# Patient Record
Sex: Male | Born: 1960
Health system: Southern US, Community
[De-identification: ages and names within clinical notes are randomized; demographics above are authoritative.]

---

## 2013-10-19 DIAGNOSIS — I10 Essential (primary) hypertension: Secondary | ICD-10-CM | POA: Diagnosis not present

## 2013-10-19 DIAGNOSIS — F17200 Nicotine dependence, unspecified, uncomplicated: Secondary | ICD-10-CM | POA: Diagnosis not present

## 2013-10-19 DIAGNOSIS — R197 Diarrhea, unspecified: Secondary | ICD-10-CM | POA: Diagnosis not present

## 2013-10-19 DIAGNOSIS — E7800 Pure hypercholesterolemia, unspecified: Secondary | ICD-10-CM | POA: Diagnosis not present

## 2013-10-19 DIAGNOSIS — R1084 Generalized abdominal pain: Secondary | ICD-10-CM | POA: Diagnosis not present

## 2013-10-19 DIAGNOSIS — Z79899 Other long term (current) drug therapy: Secondary | ICD-10-CM | POA: Diagnosis not present

## 2013-10-19 DIAGNOSIS — K5289 Other specified noninfective gastroenteritis and colitis: Secondary | ICD-10-CM | POA: Diagnosis not present

## 2013-10-19 DIAGNOSIS — D739 Disease of spleen, unspecified: Secondary | ICD-10-CM | POA: Diagnosis not present

## 2013-10-27 DIAGNOSIS — Z8719 Personal history of other diseases of the digestive system: Secondary | ICD-10-CM | POA: Diagnosis not present

## 2013-10-27 DIAGNOSIS — F17200 Nicotine dependence, unspecified, uncomplicated: Secondary | ICD-10-CM | POA: Diagnosis not present

## 2013-10-27 DIAGNOSIS — D739 Disease of spleen, unspecified: Secondary | ICD-10-CM | POA: Diagnosis not present

## 2013-10-27 DIAGNOSIS — K5289 Other specified noninfective gastroenteritis and colitis: Secondary | ICD-10-CM | POA: Diagnosis not present

## 2013-10-31 DIAGNOSIS — D739 Disease of spleen, unspecified: Secondary | ICD-10-CM | POA: Diagnosis not present

## 2013-11-14 DIAGNOSIS — Z1211 Encounter for screening for malignant neoplasm of colon: Secondary | ICD-10-CM | POA: Diagnosis not present

## 2013-11-14 DIAGNOSIS — R935 Abnormal findings on diagnostic imaging of other abdominal regions, including retroperitoneum: Secondary | ICD-10-CM | POA: Diagnosis not present

## 2013-11-14 DIAGNOSIS — R1031 Right lower quadrant pain: Secondary | ICD-10-CM | POA: Diagnosis not present

## 2013-11-14 DIAGNOSIS — F17200 Nicotine dependence, unspecified, uncomplicated: Secondary | ICD-10-CM | POA: Diagnosis not present

## 2013-11-23 DIAGNOSIS — F17200 Nicotine dependence, unspecified, uncomplicated: Secondary | ICD-10-CM | POA: Diagnosis not present

## 2013-11-23 DIAGNOSIS — I10 Essential (primary) hypertension: Secondary | ICD-10-CM | POA: Diagnosis not present

## 2013-11-23 DIAGNOSIS — K5289 Other specified noninfective gastroenteritis and colitis: Secondary | ICD-10-CM | POA: Diagnosis not present

## 2013-11-23 DIAGNOSIS — E785 Hyperlipidemia, unspecified: Secondary | ICD-10-CM | POA: Diagnosis not present

## 2013-11-23 DIAGNOSIS — D739 Disease of spleen, unspecified: Secondary | ICD-10-CM | POA: Diagnosis not present

## 2013-11-23 DIAGNOSIS — Z125 Encounter for screening for malignant neoplasm of prostate: Secondary | ICD-10-CM | POA: Diagnosis not present

## 2014-06-05 DIAGNOSIS — R102 Pelvic and perineal pain: Secondary | ICD-10-CM | POA: Diagnosis not present

## 2014-06-05 DIAGNOSIS — D739 Disease of spleen, unspecified: Secondary | ICD-10-CM | POA: Diagnosis not present

## 2014-06-05 DIAGNOSIS — I10 Essential (primary) hypertension: Secondary | ICD-10-CM | POA: Diagnosis not present

## 2014-06-05 DIAGNOSIS — E785 Hyperlipidemia, unspecified: Secondary | ICD-10-CM | POA: Diagnosis not present

## 2014-06-05 DIAGNOSIS — Z2821 Immunization not carried out because of patient refusal: Secondary | ICD-10-CM | POA: Diagnosis not present

## 2014-06-25 DIAGNOSIS — R109 Unspecified abdominal pain: Secondary | ICD-10-CM | POA: Diagnosis not present

## 2014-06-25 DIAGNOSIS — D739 Disease of spleen, unspecified: Secondary | ICD-10-CM | POA: Diagnosis not present

## 2014-08-21 DIAGNOSIS — T25222A Burn of second degree of left foot, initial encounter: Secondary | ICD-10-CM | POA: Diagnosis not present

## 2014-08-21 DIAGNOSIS — T2125XA Burn of second degree of buttock, initial encounter: Secondary | ICD-10-CM | POA: Diagnosis not present

## 2014-08-21 DIAGNOSIS — M1990 Unspecified osteoarthritis, unspecified site: Secondary | ICD-10-CM | POA: Diagnosis not present

## 2014-08-21 DIAGNOSIS — R350 Frequency of micturition: Secondary | ICD-10-CM | POA: Diagnosis not present

## 2014-08-21 DIAGNOSIS — T2124XA Burn of second degree of lower back, initial encounter: Secondary | ICD-10-CM | POA: Diagnosis not present

## 2014-08-21 DIAGNOSIS — T23661A Corrosion of second degree back of right hand, initial encounter: Secondary | ICD-10-CM | POA: Diagnosis not present

## 2014-08-21 DIAGNOSIS — Z23 Encounter for immunization: Secondary | ICD-10-CM | POA: Diagnosis not present

## 2014-08-21 DIAGNOSIS — T24211A Burn of second degree of right thigh, initial encounter: Secondary | ICD-10-CM | POA: Diagnosis not present

## 2014-08-21 DIAGNOSIS — J45909 Unspecified asthma, uncomplicated: Secondary | ICD-10-CM | POA: Diagnosis not present

## 2014-08-21 DIAGNOSIS — T310 Burns involving less than 10% of body surface: Secondary | ICD-10-CM | POA: Diagnosis not present

## 2014-08-21 DIAGNOSIS — F17290 Nicotine dependence, other tobacco product, uncomplicated: Secondary | ICD-10-CM | POA: Diagnosis not present

## 2014-08-21 DIAGNOSIS — E780 Pure hypercholesterolemia: Secondary | ICD-10-CM | POA: Diagnosis not present

## 2014-08-21 DIAGNOSIS — T24222A Burn of second degree of left knee, initial encounter: Secondary | ICD-10-CM | POA: Diagnosis not present

## 2014-08-21 DIAGNOSIS — I10 Essential (primary) hypertension: Secondary | ICD-10-CM | POA: Diagnosis not present

## 2014-08-23 DIAGNOSIS — E119 Type 2 diabetes mellitus without complications: Secondary | ICD-10-CM | POA: Diagnosis not present

## 2014-08-23 DIAGNOSIS — T24222D Burn of second degree of left knee, subsequent encounter: Secondary | ICD-10-CM | POA: Diagnosis not present

## 2014-08-23 DIAGNOSIS — T2125XD Burn of second degree of buttock, subsequent encounter: Secondary | ICD-10-CM | POA: Diagnosis not present

## 2014-08-23 DIAGNOSIS — I10 Essential (primary) hypertension: Secondary | ICD-10-CM | POA: Diagnosis not present

## 2014-08-23 DIAGNOSIS — J45909 Unspecified asthma, uncomplicated: Secondary | ICD-10-CM | POA: Diagnosis not present

## 2014-08-23 DIAGNOSIS — M150 Primary generalized (osteo)arthritis: Secondary | ICD-10-CM | POA: Diagnosis not present

## 2014-08-23 DIAGNOSIS — T24212D Burn of second degree of left thigh, subsequent encounter: Secondary | ICD-10-CM | POA: Diagnosis not present

## 2014-08-23 DIAGNOSIS — T25292D Burn of second degree of multiple sites of left ankle and foot, subsequent encounter: Secondary | ICD-10-CM | POA: Diagnosis not present

## 2014-08-23 DIAGNOSIS — J449 Chronic obstructive pulmonary disease, unspecified: Secondary | ICD-10-CM | POA: Diagnosis not present

## 2014-08-23 DIAGNOSIS — T2124XD Burn of second degree of lower back, subsequent encounter: Secondary | ICD-10-CM | POA: Diagnosis not present

## 2014-08-23 DIAGNOSIS — G629 Polyneuropathy, unspecified: Secondary | ICD-10-CM | POA: Diagnosis not present

## 2014-08-23 DIAGNOSIS — Z720 Tobacco use: Secondary | ICD-10-CM | POA: Diagnosis not present

## 2014-08-24 DIAGNOSIS — T24212D Burn of second degree of left thigh, subsequent encounter: Secondary | ICD-10-CM | POA: Diagnosis not present

## 2014-08-24 DIAGNOSIS — T24222D Burn of second degree of left knee, subsequent encounter: Secondary | ICD-10-CM | POA: Diagnosis not present

## 2014-08-24 DIAGNOSIS — T25292D Burn of second degree of multiple sites of left ankle and foot, subsequent encounter: Secondary | ICD-10-CM | POA: Diagnosis not present

## 2014-08-24 DIAGNOSIS — E119 Type 2 diabetes mellitus without complications: Secondary | ICD-10-CM | POA: Diagnosis not present

## 2014-08-24 DIAGNOSIS — T2125XD Burn of second degree of buttock, subsequent encounter: Secondary | ICD-10-CM | POA: Diagnosis not present

## 2014-08-24 DIAGNOSIS — T2124XD Burn of second degree of lower back, subsequent encounter: Secondary | ICD-10-CM | POA: Diagnosis not present

## 2014-08-27 DIAGNOSIS — T2124XD Burn of second degree of lower back, subsequent encounter: Secondary | ICD-10-CM | POA: Diagnosis not present

## 2014-08-27 DIAGNOSIS — T24212D Burn of second degree of left thigh, subsequent encounter: Secondary | ICD-10-CM | POA: Diagnosis not present

## 2014-08-27 DIAGNOSIS — T2125XD Burn of second degree of buttock, subsequent encounter: Secondary | ICD-10-CM | POA: Diagnosis not present

## 2014-08-27 DIAGNOSIS — T25292D Burn of second degree of multiple sites of left ankle and foot, subsequent encounter: Secondary | ICD-10-CM | POA: Diagnosis not present

## 2014-08-27 DIAGNOSIS — E119 Type 2 diabetes mellitus without complications: Secondary | ICD-10-CM | POA: Diagnosis not present

## 2014-08-27 DIAGNOSIS — T24222D Burn of second degree of left knee, subsequent encounter: Secondary | ICD-10-CM | POA: Diagnosis not present

## 2014-08-28 DIAGNOSIS — T2124XD Burn of second degree of lower back, subsequent encounter: Secondary | ICD-10-CM | POA: Diagnosis not present

## 2014-08-28 DIAGNOSIS — T24211D Burn of second degree of right thigh, subsequent encounter: Secondary | ICD-10-CM | POA: Diagnosis not present

## 2014-08-28 DIAGNOSIS — T25222D Burn of second degree of left foot, subsequent encounter: Secondary | ICD-10-CM | POA: Diagnosis not present

## 2014-08-28 DIAGNOSIS — T2125XD Burn of second degree of buttock, subsequent encounter: Secondary | ICD-10-CM | POA: Diagnosis not present

## 2014-08-28 DIAGNOSIS — T310 Burns involving less than 10% of body surface: Secondary | ICD-10-CM | POA: Diagnosis not present

## 2014-08-28 DIAGNOSIS — T24222D Burn of second degree of left knee, subsequent encounter: Secondary | ICD-10-CM | POA: Diagnosis not present

## 2014-08-31 DIAGNOSIS — T2124XD Burn of second degree of lower back, subsequent encounter: Secondary | ICD-10-CM | POA: Diagnosis not present

## 2014-08-31 DIAGNOSIS — T2125XD Burn of second degree of buttock, subsequent encounter: Secondary | ICD-10-CM | POA: Diagnosis not present

## 2014-08-31 DIAGNOSIS — T24222D Burn of second degree of left knee, subsequent encounter: Secondary | ICD-10-CM | POA: Diagnosis not present

## 2014-08-31 DIAGNOSIS — T25292D Burn of second degree of multiple sites of left ankle and foot, subsequent encounter: Secondary | ICD-10-CM | POA: Diagnosis not present

## 2014-08-31 DIAGNOSIS — E119 Type 2 diabetes mellitus without complications: Secondary | ICD-10-CM | POA: Diagnosis not present

## 2014-08-31 DIAGNOSIS — T24212D Burn of second degree of left thigh, subsequent encounter: Secondary | ICD-10-CM | POA: Diagnosis not present

## 2014-09-05 DIAGNOSIS — T310 Burns involving less than 10% of body surface: Secondary | ICD-10-CM | POA: Diagnosis not present

## 2014-09-05 DIAGNOSIS — T2125XD Burn of second degree of buttock, subsequent encounter: Secondary | ICD-10-CM | POA: Diagnosis not present

## 2014-09-05 DIAGNOSIS — T25222D Burn of second degree of left foot, subsequent encounter: Secondary | ICD-10-CM | POA: Diagnosis not present

## 2014-09-05 DIAGNOSIS — T2123XD Burn of second degree of upper back, subsequent encounter: Secondary | ICD-10-CM | POA: Diagnosis not present

## 2014-09-05 DIAGNOSIS — T24222D Burn of second degree of left knee, subsequent encounter: Secondary | ICD-10-CM | POA: Diagnosis not present

## 2014-09-07 DIAGNOSIS — T24222D Burn of second degree of left knee, subsequent encounter: Secondary | ICD-10-CM | POA: Diagnosis not present

## 2014-09-07 DIAGNOSIS — T2125XD Burn of second degree of buttock, subsequent encounter: Secondary | ICD-10-CM | POA: Diagnosis not present

## 2014-09-07 DIAGNOSIS — T2124XD Burn of second degree of lower back, subsequent encounter: Secondary | ICD-10-CM | POA: Diagnosis not present

## 2014-09-07 DIAGNOSIS — T25292D Burn of second degree of multiple sites of left ankle and foot, subsequent encounter: Secondary | ICD-10-CM | POA: Diagnosis not present

## 2014-09-07 DIAGNOSIS — E119 Type 2 diabetes mellitus without complications: Secondary | ICD-10-CM | POA: Diagnosis not present

## 2014-09-07 DIAGNOSIS — T24212D Burn of second degree of left thigh, subsequent encounter: Secondary | ICD-10-CM | POA: Diagnosis not present

## 2014-09-14 DIAGNOSIS — T2124XD Burn of second degree of lower back, subsequent encounter: Secondary | ICD-10-CM | POA: Diagnosis not present

## 2014-09-14 DIAGNOSIS — T24222D Burn of second degree of left knee, subsequent encounter: Secondary | ICD-10-CM | POA: Diagnosis not present

## 2014-09-14 DIAGNOSIS — T24212D Burn of second degree of left thigh, subsequent encounter: Secondary | ICD-10-CM | POA: Diagnosis not present

## 2014-09-14 DIAGNOSIS — T25292D Burn of second degree of multiple sites of left ankle and foot, subsequent encounter: Secondary | ICD-10-CM | POA: Diagnosis not present

## 2014-09-14 DIAGNOSIS — E119 Type 2 diabetes mellitus without complications: Secondary | ICD-10-CM | POA: Diagnosis not present

## 2014-09-14 DIAGNOSIS — T2125XD Burn of second degree of buttock, subsequent encounter: Secondary | ICD-10-CM | POA: Diagnosis not present

## 2015-03-25 DIAGNOSIS — Z1389 Encounter for screening for other disorder: Secondary | ICD-10-CM | POA: Diagnosis not present

## 2015-03-25 DIAGNOSIS — E785 Hyperlipidemia, unspecified: Secondary | ICD-10-CM | POA: Diagnosis not present

## 2015-03-25 DIAGNOSIS — I10 Essential (primary) hypertension: Secondary | ICD-10-CM | POA: Diagnosis not present

## 2015-03-25 DIAGNOSIS — Z79899 Other long term (current) drug therapy: Secondary | ICD-10-CM | POA: Diagnosis not present

## 2015-03-25 DIAGNOSIS — M25569 Pain in unspecified knee: Secondary | ICD-10-CM | POA: Diagnosis not present

## 2015-03-25 DIAGNOSIS — D739 Disease of spleen, unspecified: Secondary | ICD-10-CM | POA: Diagnosis not present

## 2015-03-25 DIAGNOSIS — Z6826 Body mass index (BMI) 26.0-26.9, adult: Secondary | ICD-10-CM | POA: Diagnosis not present

## 2015-11-09 DIAGNOSIS — T1502XA Foreign body in cornea, left eye, initial encounter: Secondary | ICD-10-CM | POA: Diagnosis not present

## 2016-10-13 DIAGNOSIS — Z79899 Other long term (current) drug therapy: Secondary | ICD-10-CM | POA: Diagnosis not present

## 2016-10-13 DIAGNOSIS — D739 Disease of spleen, unspecified: Secondary | ICD-10-CM | POA: Diagnosis not present

## 2016-10-13 DIAGNOSIS — M1711 Unilateral primary osteoarthritis, right knee: Secondary | ICD-10-CM | POA: Diagnosis not present

## 2016-10-13 DIAGNOSIS — M1712 Unilateral primary osteoarthritis, left knee: Secondary | ICD-10-CM | POA: Diagnosis not present

## 2016-10-13 DIAGNOSIS — M1710 Unilateral primary osteoarthritis, unspecified knee: Secondary | ICD-10-CM | POA: Diagnosis not present

## 2016-10-13 DIAGNOSIS — I10 Essential (primary) hypertension: Secondary | ICD-10-CM | POA: Diagnosis not present

## 2016-10-13 DIAGNOSIS — Z6827 Body mass index (BMI) 27.0-27.9, adult: Secondary | ICD-10-CM | POA: Diagnosis not present

## 2016-10-13 DIAGNOSIS — E785 Hyperlipidemia, unspecified: Secondary | ICD-10-CM | POA: Diagnosis not present

## 2016-10-13 DIAGNOSIS — Z9181 History of falling: Secondary | ICD-10-CM | POA: Diagnosis not present

## 2016-10-13 DIAGNOSIS — Z1211 Encounter for screening for malignant neoplasm of colon: Secondary | ICD-10-CM | POA: Diagnosis not present

## 2016-10-13 DIAGNOSIS — Z1389 Encounter for screening for other disorder: Secondary | ICD-10-CM | POA: Diagnosis not present

## 2018-02-02 DIAGNOSIS — Z23 Encounter for immunization: Secondary | ICD-10-CM | POA: Diagnosis not present

## 2018-02-02 DIAGNOSIS — Z1331 Encounter for screening for depression: Secondary | ICD-10-CM | POA: Diagnosis not present

## 2018-02-02 DIAGNOSIS — E785 Hyperlipidemia, unspecified: Secondary | ICD-10-CM | POA: Diagnosis not present

## 2018-02-02 DIAGNOSIS — Z125 Encounter for screening for malignant neoplasm of prostate: Secondary | ICD-10-CM | POA: Diagnosis not present

## 2018-02-02 DIAGNOSIS — Z1211 Encounter for screening for malignant neoplasm of colon: Secondary | ICD-10-CM | POA: Diagnosis not present

## 2018-02-02 DIAGNOSIS — Z0000 Encounter for general adult medical examination without abnormal findings: Secondary | ICD-10-CM | POA: Diagnosis not present

## 2018-02-09 DIAGNOSIS — E785 Hyperlipidemia, unspecified: Secondary | ICD-10-CM | POA: Diagnosis not present

## 2018-02-09 DIAGNOSIS — Z79899 Other long term (current) drug therapy: Secondary | ICD-10-CM | POA: Diagnosis not present

## 2018-02-09 DIAGNOSIS — M25512 Pain in left shoulder: Secondary | ICD-10-CM | POA: Diagnosis not present

## 2018-02-09 DIAGNOSIS — D739 Disease of spleen, unspecified: Secondary | ICD-10-CM | POA: Diagnosis not present

## 2018-02-09 DIAGNOSIS — I10 Essential (primary) hypertension: Secondary | ICD-10-CM | POA: Diagnosis not present

## 2018-06-30 DIAGNOSIS — R2689 Other abnormalities of gait and mobility: Secondary | ICD-10-CM | POA: Diagnosis not present

## 2019-02-10 DIAGNOSIS — K0889 Other specified disorders of teeth and supporting structures: Secondary | ICD-10-CM | POA: Diagnosis not present

## 2019-02-13 DIAGNOSIS — K0889 Other specified disorders of teeth and supporting structures: Secondary | ICD-10-CM | POA: Diagnosis not present

## 2019-04-07 ENCOUNTER — Other Ambulatory Visit: Payer: Self-pay

## 2019-04-07 NOTE — Patient Outreach (Signed)
[facility]) Care Management  04/07/2019  Richard Hunter [birth year] [id number]   Medication Adherence call to Richard Hunter HIPPA Compliant Voice message left with a call back number. Richard Hunter is showing past due on Pravastatin 40 mg and Lisinopril 10 mg under [facility].   [place] Management Direct Dial [phone]  Fax [phone] [email or web address]

## 2019-04-25 DIAGNOSIS — E785 Hyperlipidemia, unspecified: Secondary | ICD-10-CM | POA: Diagnosis not present

## 2019-04-25 DIAGNOSIS — I10 Essential (primary) hypertension: Secondary | ICD-10-CM | POA: Diagnosis not present

## 2019-04-25 DIAGNOSIS — Z79899 Other long term (current) drug therapy: Secondary | ICD-10-CM | POA: Diagnosis not present

## 2019-04-25 DIAGNOSIS — Z23 Encounter for immunization: Secondary | ICD-10-CM | POA: Diagnosis not present

## 2019-05-12 DIAGNOSIS — E785 Hyperlipidemia, unspecified: Secondary | ICD-10-CM | POA: Diagnosis not present

## 2019-05-12 DIAGNOSIS — Z87891 Personal history of nicotine dependence: Secondary | ICD-10-CM | POA: Diagnosis not present

## 2019-05-12 DIAGNOSIS — Z1211 Encounter for screening for malignant neoplasm of colon: Secondary | ICD-10-CM | POA: Diagnosis not present

## 2019-05-12 DIAGNOSIS — Z0000 Encounter for general adult medical examination without abnormal findings: Secondary | ICD-10-CM | POA: Diagnosis not present

## 2019-05-12 DIAGNOSIS — Z9181 History of falling: Secondary | ICD-10-CM | POA: Diagnosis not present

## 2019-05-26 ENCOUNTER — Other Ambulatory Visit: Payer: Self-pay

## 2019-05-26 NOTE — Patient Outreach (Signed)
[facility]) Care Management  05/26/2019  Richard Hunter [birth year] [id number]   Medication Adherence call to Richard Hunter HIPPA Compliant Voice message left with a call back number. Richard Hunter is showing past due on Pravastatin 40 mg and Lisinopril 10 mg under Richard Hunter.   [place] Management Direct Dial [phone]  Fax [phone] [email or web address]

## 2019-08-28 DIAGNOSIS — I10 Essential (primary) hypertension: Secondary | ICD-10-CM | POA: Diagnosis not present

## 2019-08-28 DIAGNOSIS — Z79899 Other long term (current) drug therapy: Secondary | ICD-10-CM | POA: Diagnosis not present

## 2019-08-28 DIAGNOSIS — E785 Hyperlipidemia, unspecified: Secondary | ICD-10-CM | POA: Diagnosis not present

## 2019-11-10 DIAGNOSIS — M79602 Pain in left arm: Secondary | ICD-10-CM | POA: Diagnosis not present

## 2019-11-10 DIAGNOSIS — M25512 Pain in left shoulder: Secondary | ICD-10-CM | POA: Diagnosis not present

## 2019-11-10 DIAGNOSIS — S4992XA Unspecified injury of left shoulder and upper arm, initial encounter: Secondary | ICD-10-CM | POA: Diagnosis not present

## 2019-12-21 DIAGNOSIS — S40019A Contusion of unspecified shoulder, initial encounter: Secondary | ICD-10-CM | POA: Diagnosis not present

## 2019-12-26 DIAGNOSIS — I10 Essential (primary) hypertension: Secondary | ICD-10-CM | POA: Diagnosis not present

## 2019-12-26 DIAGNOSIS — E785 Hyperlipidemia, unspecified: Secondary | ICD-10-CM | POA: Diagnosis not present

## 2019-12-26 DIAGNOSIS — Z79899 Other long term (current) drug therapy: Secondary | ICD-10-CM | POA: Diagnosis not present

## 2019-12-26 DIAGNOSIS — M25512 Pain in left shoulder: Secondary | ICD-10-CM | POA: Diagnosis not present

## 2019-12-27 DIAGNOSIS — M25412 Effusion, left shoulder: Secondary | ICD-10-CM | POA: Diagnosis not present

## 2019-12-27 DIAGNOSIS — M25612 Stiffness of left shoulder, not elsewhere classified: Secondary | ICD-10-CM | POA: Diagnosis not present

## 2019-12-27 DIAGNOSIS — M25512 Pain in left shoulder: Secondary | ICD-10-CM | POA: Diagnosis not present

## 2020-01-05 DIAGNOSIS — M25512 Pain in left shoulder: Secondary | ICD-10-CM | POA: Diagnosis not present

## 2020-01-05 DIAGNOSIS — M25612 Stiffness of left shoulder, not elsewhere classified: Secondary | ICD-10-CM | POA: Diagnosis not present

## 2020-01-05 DIAGNOSIS — M25412 Effusion, left shoulder: Secondary | ICD-10-CM | POA: Diagnosis not present

## 2020-01-10 DIAGNOSIS — M25412 Effusion, left shoulder: Secondary | ICD-10-CM | POA: Diagnosis not present

## 2020-01-10 DIAGNOSIS — M25612 Stiffness of left shoulder, not elsewhere classified: Secondary | ICD-10-CM | POA: Diagnosis not present

## 2020-01-10 DIAGNOSIS — M25512 Pain in left shoulder: Secondary | ICD-10-CM | POA: Diagnosis not present

## 2020-01-18 DIAGNOSIS — S40019A Contusion of unspecified shoulder, initial encounter: Secondary | ICD-10-CM | POA: Diagnosis not present

## 2020-01-25 DIAGNOSIS — M19012 Primary osteoarthritis, left shoulder: Secondary | ICD-10-CM | POA: Diagnosis not present

## 2020-01-25 DIAGNOSIS — S43432A Superior glenoid labrum lesion of left shoulder, initial encounter: Secondary | ICD-10-CM | POA: Diagnosis not present

## 2020-01-25 DIAGNOSIS — S40019A Contusion of unspecified shoulder, initial encounter: Secondary | ICD-10-CM | POA: Diagnosis not present

## 2020-01-25 DIAGNOSIS — M25512 Pain in left shoulder: Secondary | ICD-10-CM | POA: Diagnosis not present

## 2020-02-01 DIAGNOSIS — M75120 Complete rotator cuff tear or rupture of unspecified shoulder, not specified as traumatic: Secondary | ICD-10-CM | POA: Diagnosis not present

## 2020-02-14 DIAGNOSIS — Z1152 Encounter for screening for COVID-19: Secondary | ICD-10-CM | POA: Diagnosis not present

## 2020-02-14 DIAGNOSIS — Z1159 Encounter for screening for other viral diseases: Secondary | ICD-10-CM | POA: Diagnosis not present

## 2020-02-19 DIAGNOSIS — M19012 Primary osteoarthritis, left shoulder: Secondary | ICD-10-CM | POA: Diagnosis not present

## 2020-02-19 DIAGNOSIS — I10 Essential (primary) hypertension: Secondary | ICD-10-CM | POA: Diagnosis not present

## 2020-02-19 DIAGNOSIS — S46012A Strain of muscle(s) and tendon(s) of the rotator cuff of left shoulder, initial encounter: Secondary | ICD-10-CM | POA: Diagnosis not present

## 2020-02-19 DIAGNOSIS — Z79899 Other long term (current) drug therapy: Secondary | ICD-10-CM | POA: Diagnosis not present

## 2020-02-19 DIAGNOSIS — M1990 Unspecified osteoarthritis, unspecified site: Secondary | ICD-10-CM | POA: Diagnosis not present

## 2020-02-19 DIAGNOSIS — G8918 Other acute postprocedural pain: Secondary | ICD-10-CM | POA: Diagnosis not present

## 2020-02-19 DIAGNOSIS — M75122 Complete rotator cuff tear or rupture of left shoulder, not specified as traumatic: Secondary | ICD-10-CM | POA: Diagnosis not present

## 2020-02-19 DIAGNOSIS — E785 Hyperlipidemia, unspecified: Secondary | ICD-10-CM | POA: Diagnosis not present

## 2020-02-19 DIAGNOSIS — M12812 Other specific arthropathies, not elsewhere classified, left shoulder: Secondary | ICD-10-CM | POA: Diagnosis not present

## 2020-02-20 DIAGNOSIS — M79602 Pain in left arm: Secondary | ICD-10-CM | POA: Diagnosis not present

## 2020-02-20 DIAGNOSIS — G8918 Other acute postprocedural pain: Secondary | ICD-10-CM | POA: Diagnosis not present

## 2020-02-20 DIAGNOSIS — Z5321 Procedure and treatment not carried out due to patient leaving prior to being seen by health care provider: Secondary | ICD-10-CM | POA: Diagnosis not present

## 2020-02-23 DIAGNOSIS — M6281 Muscle weakness (generalized): Secondary | ICD-10-CM | POA: Diagnosis not present

## 2020-02-23 DIAGNOSIS — M25512 Pain in left shoulder: Secondary | ICD-10-CM | POA: Diagnosis not present

## 2020-02-23 DIAGNOSIS — M25612 Stiffness of left shoulder, not elsewhere classified: Secondary | ICD-10-CM | POA: Diagnosis not present

## 2020-02-27 DIAGNOSIS — M6281 Muscle weakness (generalized): Secondary | ICD-10-CM | POA: Diagnosis not present

## 2020-02-27 DIAGNOSIS — M25512 Pain in left shoulder: Secondary | ICD-10-CM | POA: Diagnosis not present

## 2020-02-27 DIAGNOSIS — M25612 Stiffness of left shoulder, not elsewhere classified: Secondary | ICD-10-CM | POA: Diagnosis not present

## 2020-02-29 DIAGNOSIS — M25512 Pain in left shoulder: Secondary | ICD-10-CM | POA: Diagnosis not present

## 2020-02-29 DIAGNOSIS — M6281 Muscle weakness (generalized): Secondary | ICD-10-CM | POA: Diagnosis not present

## 2020-02-29 DIAGNOSIS — M25612 Stiffness of left shoulder, not elsewhere classified: Secondary | ICD-10-CM | POA: Diagnosis not present

## 2020-03-03 DIAGNOSIS — K029 Dental caries, unspecified: Secondary | ICD-10-CM | POA: Diagnosis not present

## 2020-03-03 DIAGNOSIS — Z79899 Other long term (current) drug therapy: Secondary | ICD-10-CM | POA: Diagnosis not present

## 2020-03-03 DIAGNOSIS — J45909 Unspecified asthma, uncomplicated: Secondary | ICD-10-CM | POA: Diagnosis not present

## 2020-03-03 DIAGNOSIS — K047 Periapical abscess without sinus: Secondary | ICD-10-CM | POA: Diagnosis not present

## 2020-03-03 DIAGNOSIS — I10 Essential (primary) hypertension: Secondary | ICD-10-CM | POA: Diagnosis not present

## 2020-03-04 DIAGNOSIS — K029 Dental caries, unspecified: Secondary | ICD-10-CM | POA: Diagnosis not present

## 2020-03-06 DIAGNOSIS — M25612 Stiffness of left shoulder, not elsewhere classified: Secondary | ICD-10-CM | POA: Diagnosis not present

## 2020-03-06 DIAGNOSIS — M6281 Muscle weakness (generalized): Secondary | ICD-10-CM | POA: Diagnosis not present

## 2020-03-06 DIAGNOSIS — M25512 Pain in left shoulder: Secondary | ICD-10-CM | POA: Diagnosis not present

## 2020-03-12 DIAGNOSIS — M25612 Stiffness of left shoulder, not elsewhere classified: Secondary | ICD-10-CM | POA: Diagnosis not present

## 2020-03-12 DIAGNOSIS — M25512 Pain in left shoulder: Secondary | ICD-10-CM | POA: Diagnosis not present

## 2020-03-12 DIAGNOSIS — M6281 Muscle weakness (generalized): Secondary | ICD-10-CM | POA: Diagnosis not present

## 2020-03-18 DIAGNOSIS — Z5321 Procedure and treatment not carried out due to patient leaving prior to being seen by health care provider: Secondary | ICD-10-CM | POA: Diagnosis not present

## 2020-03-18 DIAGNOSIS — M79603 Pain in arm, unspecified: Secondary | ICD-10-CM | POA: Diagnosis not present

## 2020-03-18 DIAGNOSIS — G8918 Other acute postprocedural pain: Secondary | ICD-10-CM | POA: Diagnosis not present

## 2020-03-19 DIAGNOSIS — M25512 Pain in left shoulder: Secondary | ICD-10-CM | POA: Diagnosis not present

## 2020-03-19 DIAGNOSIS — M25612 Stiffness of left shoulder, not elsewhere classified: Secondary | ICD-10-CM | POA: Diagnosis not present

## 2020-03-19 DIAGNOSIS — M6281 Muscle weakness (generalized): Secondary | ICD-10-CM | POA: Diagnosis not present

## 2020-03-21 DIAGNOSIS — Z9889 Other specified postprocedural states: Secondary | ICD-10-CM | POA: Diagnosis not present

## 2020-03-21 DIAGNOSIS — M25512 Pain in left shoulder: Secondary | ICD-10-CM | POA: Diagnosis not present

## 2020-03-21 DIAGNOSIS — Z79899 Other long term (current) drug therapy: Secondary | ICD-10-CM | POA: Diagnosis not present

## 2020-03-21 DIAGNOSIS — M6281 Muscle weakness (generalized): Secondary | ICD-10-CM | POA: Diagnosis not present

## 2020-03-21 DIAGNOSIS — M25612 Stiffness of left shoulder, not elsewhere classified: Secondary | ICD-10-CM | POA: Diagnosis not present

## 2020-03-27 DIAGNOSIS — M6281 Muscle weakness (generalized): Secondary | ICD-10-CM | POA: Diagnosis not present

## 2020-03-27 DIAGNOSIS — M25512 Pain in left shoulder: Secondary | ICD-10-CM | POA: Diagnosis not present

## 2020-03-27 DIAGNOSIS — M25612 Stiffness of left shoulder, not elsewhere classified: Secondary | ICD-10-CM | POA: Diagnosis not present

## 2020-04-01 DIAGNOSIS — M6281 Muscle weakness (generalized): Secondary | ICD-10-CM | POA: Diagnosis not present

## 2020-04-01 DIAGNOSIS — M25512 Pain in left shoulder: Secondary | ICD-10-CM | POA: Diagnosis not present

## 2020-04-01 DIAGNOSIS — M25612 Stiffness of left shoulder, not elsewhere classified: Secondary | ICD-10-CM | POA: Diagnosis not present

## 2020-04-08 DIAGNOSIS — M25512 Pain in left shoulder: Secondary | ICD-10-CM | POA: Diagnosis not present

## 2020-04-08 DIAGNOSIS — M6281 Muscle weakness (generalized): Secondary | ICD-10-CM | POA: Diagnosis not present

## 2020-04-08 DIAGNOSIS — M25612 Stiffness of left shoulder, not elsewhere classified: Secondary | ICD-10-CM | POA: Diagnosis not present

## 2020-04-10 DIAGNOSIS — M25512 Pain in left shoulder: Secondary | ICD-10-CM | POA: Diagnosis not present

## 2020-04-10 DIAGNOSIS — M6281 Muscle weakness (generalized): Secondary | ICD-10-CM | POA: Diagnosis not present

## 2020-04-10 DIAGNOSIS — M25612 Stiffness of left shoulder, not elsewhere classified: Secondary | ICD-10-CM | POA: Diagnosis not present

## 2020-04-18 DIAGNOSIS — M6281 Muscle weakness (generalized): Secondary | ICD-10-CM | POA: Diagnosis not present

## 2020-04-18 DIAGNOSIS — M25612 Stiffness of left shoulder, not elsewhere classified: Secondary | ICD-10-CM | POA: Diagnosis not present

## 2020-04-18 DIAGNOSIS — M25512 Pain in left shoulder: Secondary | ICD-10-CM | POA: Diagnosis not present

## 2020-04-25 DIAGNOSIS — M6281 Muscle weakness (generalized): Secondary | ICD-10-CM | POA: Diagnosis not present

## 2020-04-25 DIAGNOSIS — M25512 Pain in left shoulder: Secondary | ICD-10-CM | POA: Diagnosis not present

## 2020-04-25 DIAGNOSIS — M25612 Stiffness of left shoulder, not elsewhere classified: Secondary | ICD-10-CM | POA: Diagnosis not present

## 2020-04-30 DIAGNOSIS — I10 Essential (primary) hypertension: Secondary | ICD-10-CM | POA: Diagnosis not present

## 2020-04-30 DIAGNOSIS — Z79899 Other long term (current) drug therapy: Secondary | ICD-10-CM | POA: Diagnosis not present

## 2020-04-30 DIAGNOSIS — Z23 Encounter for immunization: Secondary | ICD-10-CM | POA: Diagnosis not present

## 2020-04-30 DIAGNOSIS — E785 Hyperlipidemia, unspecified: Secondary | ICD-10-CM | POA: Diagnosis not present

## 2020-04-30 DIAGNOSIS — E119 Type 2 diabetes mellitus without complications: Secondary | ICD-10-CM | POA: Diagnosis not present

## 2020-04-30 DIAGNOSIS — H47233 Glaucomatous optic atrophy, bilateral: Secondary | ICD-10-CM | POA: Diagnosis not present

## 2020-07-13 DIAGNOSIS — U071 COVID-19: Secondary | ICD-10-CM | POA: Diagnosis not present

## 2020-07-13 DIAGNOSIS — R0602 Shortness of breath: Secondary | ICD-10-CM | POA: Diagnosis not present

## 2020-07-13 DIAGNOSIS — R059 Cough, unspecified: Secondary | ICD-10-CM | POA: Diagnosis not present

## 2020-09-03 DIAGNOSIS — E785 Hyperlipidemia, unspecified: Secondary | ICD-10-CM | POA: Diagnosis not present

## 2020-09-03 DIAGNOSIS — I10 Essential (primary) hypertension: Secondary | ICD-10-CM | POA: Diagnosis not present

## 2020-09-03 DIAGNOSIS — Z79899 Other long term (current) drug therapy: Secondary | ICD-10-CM | POA: Diagnosis not present

## 2020-12-17 DIAGNOSIS — Z0000 Encounter for general adult medical examination without abnormal findings: Secondary | ICD-10-CM | POA: Diagnosis not present

## 2020-12-17 DIAGNOSIS — Z9181 History of falling: Secondary | ICD-10-CM | POA: Diagnosis not present

## 2020-12-17 DIAGNOSIS — E785 Hyperlipidemia, unspecified: Secondary | ICD-10-CM | POA: Diagnosis not present

## 2021-01-07 DIAGNOSIS — E785 Hyperlipidemia, unspecified: Secondary | ICD-10-CM | POA: Diagnosis not present

## 2021-01-07 DIAGNOSIS — I10 Essential (primary) hypertension: Secondary | ICD-10-CM | POA: Diagnosis not present

## 2021-01-07 DIAGNOSIS — Z79899 Other long term (current) drug therapy: Secondary | ICD-10-CM | POA: Diagnosis not present

## 2021-02-04 ENCOUNTER — Emergency Department (HOSPITAL_COMMUNITY)
Admission: EM | Admit: 2021-02-04 | Discharge: 2021-02-04 | Disposition: A | Discharge status: Home / Self Care | Payer: Medicare Other | Attending: Emergency Medicine | Admitting: Emergency Medicine

## 2021-02-04 ENCOUNTER — Other Ambulatory Visit: Payer: Self-pay

## 2021-02-04 ENCOUNTER — Emergency Department (HOSPITAL_COMMUNITY): Payer: Medicare Other

## 2021-02-04 ENCOUNTER — Encounter (HOSPITAL_COMMUNITY): Payer: Self-pay | Admitting: Emergency Medicine

## 2021-02-04 DIAGNOSIS — R079 Chest pain, unspecified: Secondary | ICD-10-CM | POA: Diagnosis not present

## 2021-02-04 DIAGNOSIS — M25712 Osteophyte, left shoulder: Secondary | ICD-10-CM | POA: Diagnosis not present

## 2021-02-04 DIAGNOSIS — M25512 Pain in left shoulder: Secondary | ICD-10-CM | POA: Insufficient documentation

## 2021-02-04 DIAGNOSIS — M19012 Primary osteoarthritis, left shoulder: Secondary | ICD-10-CM | POA: Diagnosis not present

## 2021-02-04 DIAGNOSIS — G8929 Other chronic pain: Secondary | ICD-10-CM | POA: Insufficient documentation

## 2021-02-04 DIAGNOSIS — R531 Weakness: Secondary | ICD-10-CM | POA: Diagnosis not present

## 2021-02-04 LAB — CBC
HCT: 41.2 % (ref 39.0–52.0)
Hemoglobin: 13.4 g/dL (ref 13.0–17.0)
MCH: 29.6 pg (ref 26.0–34.0)
MCHC: 32.5 g/dL (ref 30.0–36.0)
MCV: 90.9 fL (ref 80.0–100.0)
Platelets: 225 10*3/uL (ref 150–400)
RBC: 4.53 MIL/uL (ref 4.22–5.81)
RDW: 11.9 % (ref 11.5–15.5)
WBC: 9.4 10*3/uL (ref 4.0–10.5)
nRBC: 0 % (ref 0.0–0.2)

## 2021-02-04 LAB — BASIC METABOLIC PANEL
Anion gap: 6 (ref 5–15)
BUN: 11 mg/dL (ref 6–20)
CO2: 21 mmol/L — ABNORMAL LOW (ref 22–32)
Calcium: 9.4 mg/dL (ref 8.9–10.3)
Chloride: 106 mmol/L (ref 98–111)
Creatinine, Ser: 0.75 mg/dL (ref 0.61–1.24)
GFR, Estimated: >60 mL/min (ref >60)
Glucose, Bld: 117 mg/dL — ABNORMAL HIGH (ref 70–99)
Potassium: 4.2 mmol/L (ref 3.5–5.1)
Sodium: 133 mmol/L — ABNORMAL LOW (ref 135–145)

## 2021-02-04 LAB — TROPONIN I (HIGH SENSITIVITY): Troponin I (High Sensitivity): 8 ng/L (ref <18)

## 2021-02-04 MED ORDER — OXYCODONE HCL 5 MG PO TABS
10.0000 mg | ORAL_TABLET | Freq: Once | ORAL | Status: AC
  Administered 2021-02-04: 10 mg via ORAL
  Filled 2021-02-04: qty 2

## 2021-02-04 MED ORDER — OXYCODONE HCL 5 MG PO TABS: 5.0000 mg | ORAL_TABLET | Freq: Four times a day (QID) | ORAL | 0 refills | Status: AC | PRN

## 2021-02-04 MED ORDER — IBUPROFEN 600 MG PO TABS: 600.0000 mg | ORAL_TABLET | Freq: Four times a day (QID) | ORAL | 0 refills | Status: AC | PRN

## 2021-02-04 NOTE — ED Provider Notes (Signed)
[facility] EMERGENCY DEPARTMENT Provider Note   CSN: [id number] Arrival date & time: 02/04/21  9833     History CC: Shoulder pain   Richard Hunter is a 60 y.o. male presenting to emergency department left-sided shoulder pain.  He reports he had an operation at 11 months ago at [facility] with orthopedic doctor.  Is not clear what the operation was for.  He reports he has had severe pain in his shoulder since then.  He feels it is getting worse recently.  He has tried rehab.  He has a sling at home.  He is not happy with that surgeon.  He is looking for second opinion.  He reports he has a PCP appointment coming up this week.  He has been taking Tylenol ibuprofen at home, but is not getting enough relief.  He is concerned that is not able to sleep due to pain.  HPI     History reviewed. No pertinent past medical history.  There are no problems to display for this patient.   History reviewed. No pertinent surgical history.     No family history on file.     Home Medications Prior to Admission medications   Medication Sig Start Date End Date Taking? Authorizing Provider  ibuprofen (ADVIL) 600 MG tablet Take 1 tablet (600 mg total) by mouth every 6 (six) hours as needed for up to 30 doses for mild pain or moderate pain. 02/04/21  Yes Sparrow Sanzo, Kermit Balo, MD  oxyCODONE (ROXICODONE) 5 MG immediate release tablet Take 1 tablet (5 mg total) by mouth every 6 (six) hours as needed for up to 12 days for severe pain. 02/04/21 02/16/21 Yes Nickalus Thornsberry, Kermit Balo, MD    Allergies    Patient has no allergy information on record.  Review of Systems   Review of Systems  Constitutional:  Negative for chills and fever.  Respiratory:  Negative for cough and shortness of breath.   Cardiovascular:  Negative for chest pain and palpitations.  Gastrointestinal:  Negative for abdominal pain and vomiting.  Musculoskeletal:  Positive for arthralgias and myalgias.  Skin:  Negative for  color change and rash.  Neurological:  Negative for seizures and syncope.  All other systems reviewed and are negative.  Physical Exam Updated Vital Signs BP (!) 149/93 (BP Location: Right Arm)   Pulse 67   Temp 98.6 F (37 C) (Oral)   Resp 16   SpO2 99%   Physical Exam Constitutional:      General: He is not in acute distress. HENT:     Head: Normocephalic and atraumatic.  Eyes:     Conjunctiva/sclera: Conjunctivae normal.     Pupils: Pupils are equal, round, and reactive to light.  Cardiovascular:     Rate and Rhythm: Normal rate and regular rhythm.  Pulmonary:     Effort: Pulmonary effort is normal. No respiratory distress.  Musculoskeletal:     Comments: Surgical scar left anterior shoulder intact, nonerythematous  Skin:    General: Skin is warm and dry.  Neurological:     General: No focal deficit present.     Mental Status: He is alert and oriented to person, place, and time. Mental status is at baseline.     Motor: No weakness.  Psychiatric:        Mood and Affect: Mood normal.        Behavior: Behavior normal.    ED Results / Procedures / Treatments   Labs (all labs ordered are  listed, but only abnormal results are displayed) Labs Reviewed  BASIC METABOLIC PANEL - Abnormal; Notable for the following components:      Result Value   Sodium 133 (*)    CO2 21 (*)    Glucose, Bld 117 (*)    All other components within normal limits  CBC  TROPONIN I (HIGH SENSITIVITY)  TROPONIN I (HIGH SENSITIVITY)    EKG None  Radiology DG Chest 2 View  Result Date: 02/04/2021 CLINICAL DATA:  Weakness. EXAM: CHEST - 2 VIEW COMPARISON:  Chest x-ray 06/30/2018. FINDINGS: Mediastinum hilar structures normal. Heart size normal. Mild right base atelectasis/infiltrate. No pleural effusion or pneumothorax. No acute bony abnormality. IMPRESSION: Mild right base atelectasis/infiltrate. Electronically Signed   By: Maisie Fus  Register   On: 02/04/2021 09:21   DG Shoulder  Left  Result Date: 02/04/2021 CLINICAL DATA:  Shoulder and chest pain. EXAM: LEFT SHOULDER - 2+ VIEW COMPARISON:  MRI 01/24/2020.  Left shoulder series 11/10/2019. FINDINGS: Mild acromioclavicular and glenohumeral degenerative change. Mild subacromial spurring. No evidence of fracture or dislocation. No evidence of separation. IMPRESSION: Mild acromioclavicular and glenohumeral degenerative change. Mild subacromial spurring. No acute abnormality. Electronically Signed   By: Maisie Fus  Register   On: 02/04/2021 09:18    Procedures Procedures   Medications Ordered in ED Medications  oxyCODONE (Oxy IR/ROXICODONE) immediate release tablet 10 mg (10 mg Oral Given 02/04/21 1200)    ED Course  I have reviewed the triage vital signs and the nursing notes.  Pertinent labs & imaging results that were available during my care of the patient were reviewed by me and considered in my medical decision making (see chart for details).  Patient is here for follow-up postoperative surgical pain.  No evidence of infection on exam.  The operation was several months ago.  X-ray showed no acute fracture or dislocation.  Suspect this is chronic nerve pain related to his operation.  I strongly encouraged him to follow-up with his original surgeon, although he reports he is not happy with that surgeon, and I will give him an orthopedic number for one of our providers if he is looking for second opinion.  We can give him a dose of stronger pain medications here, and a very short-term prescription until he can follow-up with his current PCP.  I explained that the ED does not provide long-term narcotic or pain medication prescription for chronic pain.  He verbalized understanding.    Final Clinical Impression(s) / ED Diagnoses Final diagnoses:  Chronic left shoulder pain    Rx / DC Orders ED Discharge Orders          Ordered    oxyCODONE (ROXICODONE) 5 MG immediate release tablet  Every 6 hours PRN        02/04/21 1151     ibuprofen (ADVIL) 600 MG tablet  Every 6 hours PRN        02/04/21 1151             Unika Nazareno, Kermit Balo, MD 02/04/21 1736

## 2021-02-04 NOTE — Discharge Instructions (Addendum)
I strongly recommend that you call your original surgeon and talk about your persistent pain.  They should be the ones guiding your therapy and recovery.  If you are not happy with the surgeon and are looking for second opinion, you can make an appointment with Dr. Aundria Rud at the number above.  Chronic Pain Discharge Instructions  Please be aware of our hospital's policy regarding opioids, narcotics, and controlled substances.  Emergency care providers appreciate that many patients coming to [place] are in severe pain, and we wish to address their pain in the safest, most responsible manner.  It is important to recognize however, that the proper treatment of chronic pain differs from that of the pain of injuries and acute illnesses.  Our goal is to provide quality, safe, personalized care and we thank you for giving [place] the opportunity to serve you.  If you have a chronic pain syndrome (i.e. chronic headaches, recurrent back or neck pain, dental pain, abdominal or pelvis pain without a specific diagnosis, or neuropathic pain such as fibromyalgia) or recurrent visits for the same condition without an acute diagnosis, you may be treated with non-narcotics and other non-addictive medicines.  Patients managing chronic pain should have provisions in place with their primary care doctor for breakthrough pain. It is every patient's personal responsibility to maintain active prescriptions with his or her primary care physician or specialist. If you are in crisis, you should call your primary physician first.  If your physician directs you to the emergency department, please have the doctor call and speak to our attending physician concerning your care.  The use of narcotics and related agents for chronic pain syndromes may lead to many physical and psychological problems.  Nearly as many people die from prescription narcotics each year as die from car crashes.  Additionally, this risk is known to increase if such  prescriptions are obtained from a variety of sources.  Therefore, your name may be checked first through the [facility] Controlled Substances Reporting System.  This database is a record of controlled substance medication prescriptions that the patient has received.  This has been established by [facility] in an effort to eliminate the dangerous, and often life threatening, practice of obtaining multiple prescriptions from different medical providers. Only your primary care physician or a pain management specialist is able to safely treat such syndromes with narcotic medications long-term.    In those rare situations where the Emergency Department physician feels narcotic medications are appropriate, the physician will prescribe these in very limited quantities.  The amount of these medications will last only until you can see your primary care physician in his/her office.  Any patient who returns to the ED seeking refills should expect only non-narcotic pain medications.  Prescriptions for narcotic or sedating medications that have been lost, stolen or expired will not be refilled in the Emergency Department.    Finally, in the event of an acute medical condition exists and the emergency physician feels it is necessary that the patient be given a narcotic or sedating medication -  a responsible adult driver should available to provide the patient with safe transportation home.

## 2021-02-04 NOTE — ED Triage Notes (Signed)
Pt here with c/ o chronic left shoulder pain , pt had surgery 11 months ago at [facility]

## 2021-02-04 NOTE — ED Notes (Signed)
Pt d/c home per MD order. Discharge summary reviewed with pt. Pt verbalizes understanding. No s/s of acute distress noted at discharge. Ambulatory off unit. Reports brother is discharge ride home.

## 2021-05-13 DIAGNOSIS — M25512 Pain in left shoulder: Secondary | ICD-10-CM | POA: Diagnosis not present

## 2021-05-13 DIAGNOSIS — G8929 Other chronic pain: Secondary | ICD-10-CM | POA: Diagnosis not present

## 2021-05-13 DIAGNOSIS — Z79899 Other long term (current) drug therapy: Secondary | ICD-10-CM | POA: Diagnosis not present

## 2021-08-13 DIAGNOSIS — Z79899 Other long term (current) drug therapy: Secondary | ICD-10-CM | POA: Diagnosis not present

## 2021-08-13 DIAGNOSIS — I10 Essential (primary) hypertension: Secondary | ICD-10-CM | POA: Diagnosis not present

## 2021-08-13 DIAGNOSIS — E785 Hyperlipidemia, unspecified: Secondary | ICD-10-CM | POA: Diagnosis not present

## 2021-09-10 DIAGNOSIS — I10 Essential (primary) hypertension: Secondary | ICD-10-CM | POA: Diagnosis not present

## 2021-09-10 DIAGNOSIS — K439 Ventral hernia without obstruction or gangrene: Secondary | ICD-10-CM | POA: Diagnosis not present

## 2021-10-13 DIAGNOSIS — M25562 Pain in left knee: Secondary | ICD-10-CM | POA: Diagnosis not present

## 2021-10-13 DIAGNOSIS — F17210 Nicotine dependence, cigarettes, uncomplicated: Secondary | ICD-10-CM | POA: Diagnosis not present

## 2021-10-13 DIAGNOSIS — E7800 Pure hypercholesterolemia, unspecified: Secondary | ICD-10-CM | POA: Diagnosis not present

## 2021-10-13 DIAGNOSIS — M1990 Unspecified osteoarthritis, unspecified site: Secondary | ICD-10-CM | POA: Diagnosis not present

## 2021-10-13 DIAGNOSIS — S8392XA Sprain of unspecified site of left knee, initial encounter: Secondary | ICD-10-CM | POA: Diagnosis not present

## 2021-10-13 DIAGNOSIS — S8390XA Sprain of unspecified site of unspecified knee, initial encounter: Secondary | ICD-10-CM | POA: Diagnosis not present

## 2021-10-13 DIAGNOSIS — I10 Essential (primary) hypertension: Secondary | ICD-10-CM | POA: Diagnosis not present

## 2021-12-24 DIAGNOSIS — Z9181 History of falling: Secondary | ICD-10-CM | POA: Diagnosis not present

## 2021-12-24 DIAGNOSIS — E785 Hyperlipidemia, unspecified: Secondary | ICD-10-CM | POA: Diagnosis not present

## 2021-12-24 DIAGNOSIS — Z0000 Encounter for general adult medical examination without abnormal findings: Secondary | ICD-10-CM | POA: Diagnosis not present

## 2021-12-25 DIAGNOSIS — M25562 Pain in left knee: Secondary | ICD-10-CM | POA: Diagnosis not present

## 2021-12-25 DIAGNOSIS — K439 Ventral hernia without obstruction or gangrene: Secondary | ICD-10-CM | POA: Diagnosis not present

## 2021-12-25 DIAGNOSIS — E785 Hyperlipidemia, unspecified: Secondary | ICD-10-CM | POA: Diagnosis not present

## 2021-12-25 DIAGNOSIS — I10 Essential (primary) hypertension: Secondary | ICD-10-CM | POA: Diagnosis not present

## 2022-01-12 DIAGNOSIS — M1712 Unilateral primary osteoarthritis, left knee: Secondary | ICD-10-CM | POA: Diagnosis not present

## 2022-04-30 DIAGNOSIS — F17210 Nicotine dependence, cigarettes, uncomplicated: Secondary | ICD-10-CM | POA: Diagnosis not present

## 2022-04-30 DIAGNOSIS — I10 Essential (primary) hypertension: Secondary | ICD-10-CM | POA: Diagnosis not present

## 2022-04-30 DIAGNOSIS — J40 Bronchitis, not specified as acute or chronic: Secondary | ICD-10-CM | POA: Diagnosis not present

## 2022-04-30 DIAGNOSIS — Z1152 Encounter for screening for COVID-19: Secondary | ICD-10-CM | POA: Diagnosis not present

## 2022-04-30 DIAGNOSIS — F17200 Nicotine dependence, unspecified, uncomplicated: Secondary | ICD-10-CM | POA: Diagnosis not present

## 2022-04-30 DIAGNOSIS — J029 Acute pharyngitis, unspecified: Secondary | ICD-10-CM | POA: Diagnosis not present

## 2022-04-30 DIAGNOSIS — R059 Cough, unspecified: Secondary | ICD-10-CM | POA: Diagnosis not present

## 2022-06-30 DIAGNOSIS — G8929 Other chronic pain: Secondary | ICD-10-CM | POA: Diagnosis not present

## 2022-06-30 DIAGNOSIS — K439 Ventral hernia without obstruction or gangrene: Secondary | ICD-10-CM | POA: Diagnosis not present

## 2022-06-30 DIAGNOSIS — M25512 Pain in left shoulder: Secondary | ICD-10-CM | POA: Diagnosis not present

## 2022-06-30 DIAGNOSIS — E785 Hyperlipidemia, unspecified: Secondary | ICD-10-CM | POA: Diagnosis not present

## 2022-06-30 DIAGNOSIS — I10 Essential (primary) hypertension: Secondary | ICD-10-CM | POA: Diagnosis not present

## 2022-09-03 DIAGNOSIS — Z79899 Other long term (current) drug therapy: Secondary | ICD-10-CM | POA: Diagnosis not present

## 2022-09-03 DIAGNOSIS — M25562 Pain in left knee: Secondary | ICD-10-CM | POA: Diagnosis not present

## 2022-09-03 DIAGNOSIS — M25512 Pain in left shoulder: Secondary | ICD-10-CM | POA: Diagnosis not present

## 2022-09-03 DIAGNOSIS — G894 Chronic pain syndrome: Secondary | ICD-10-CM | POA: Diagnosis not present

## 2022-09-03 DIAGNOSIS — Z1389 Encounter for screening for other disorder: Secondary | ICD-10-CM | POA: Diagnosis not present

## 2022-10-05 DIAGNOSIS — M25511 Pain in right shoulder: Secondary | ICD-10-CM | POA: Diagnosis not present

## 2022-10-05 DIAGNOSIS — M25611 Stiffness of right shoulder, not elsewhere classified: Secondary | ICD-10-CM | POA: Diagnosis not present

## 2022-10-07 ENCOUNTER — Telehealth: Payer: Self-pay

## 2022-10-07 NOTE — Telephone Encounter (Signed)
        Patient  visited [place] on 3/29     Telephone encounter attempt :  1st  A HIPAA compliant voice message was left requesting a return call.  Instructed patient to call back     [facility] [phone] [address], [place], [place] [address] Phone: [phone] Email: [email or web address]

## 2022-10-08 ENCOUNTER — Telehealth: Payer: Self-pay

## 2022-10-08 NOTE — Telephone Encounter (Signed)
     Patient  visit on 3/29  at [facility]   Have you been able to follow up with your primary care physician? Yes   The patient was or was not able to obtain any needed medicine or equipment. Yes   Are there diet recommendations that you are having difficulty following? Na   Patient expresses understanding of discharge instructions and education provided has no other needs at this time.  Yes     [facility] [phone] [address], [place], [place] [address] Phone: [phone] Email: [email or web address]

## 2022-10-09 DIAGNOSIS — M25511 Pain in right shoulder: Secondary | ICD-10-CM | POA: Diagnosis not present

## 2022-10-09 DIAGNOSIS — M25611 Stiffness of right shoulder, not elsewhere classified: Secondary | ICD-10-CM | POA: Diagnosis not present

## 2022-10-14 DIAGNOSIS — M25511 Pain in right shoulder: Secondary | ICD-10-CM | POA: Diagnosis not present

## 2022-10-14 DIAGNOSIS — M25611 Stiffness of right shoulder, not elsewhere classified: Secondary | ICD-10-CM | POA: Diagnosis not present

## 2022-10-16 DIAGNOSIS — M25611 Stiffness of right shoulder, not elsewhere classified: Secondary | ICD-10-CM | POA: Diagnosis not present

## 2022-10-16 DIAGNOSIS — M25511 Pain in right shoulder: Secondary | ICD-10-CM | POA: Diagnosis not present

## 2022-10-20 DIAGNOSIS — M25611 Stiffness of right shoulder, not elsewhere classified: Secondary | ICD-10-CM | POA: Diagnosis not present

## 2022-10-20 DIAGNOSIS — M25511 Pain in right shoulder: Secondary | ICD-10-CM | POA: Diagnosis not present

## 2022-10-22 DIAGNOSIS — M25512 Pain in left shoulder: Secondary | ICD-10-CM | POA: Diagnosis not present

## 2022-10-22 DIAGNOSIS — M25562 Pain in left knee: Secondary | ICD-10-CM | POA: Diagnosis not present

## 2022-10-23 DIAGNOSIS — M25511 Pain in right shoulder: Secondary | ICD-10-CM | POA: Diagnosis not present

## 2022-10-23 DIAGNOSIS — M25611 Stiffness of right shoulder, not elsewhere classified: Secondary | ICD-10-CM | POA: Diagnosis not present

## 2022-10-28 DIAGNOSIS — M25511 Pain in right shoulder: Secondary | ICD-10-CM | POA: Diagnosis not present

## 2022-10-28 DIAGNOSIS — M25611 Stiffness of right shoulder, not elsewhere classified: Secondary | ICD-10-CM | POA: Diagnosis not present

## 2022-10-30 DIAGNOSIS — M25511 Pain in right shoulder: Secondary | ICD-10-CM | POA: Diagnosis not present

## 2022-10-30 DIAGNOSIS — M25611 Stiffness of right shoulder, not elsewhere classified: Secondary | ICD-10-CM | POA: Diagnosis not present

## 2022-10-30 IMAGING — DX DG CHEST 2V
2 series · 2 of 2 positions shown · non-contrast
Comparison: Chest x-ray 06/30/2018.

CLINICAL DATA: Weakness.

EXAM:
CHEST - 2 VIEW

[w chest pa]
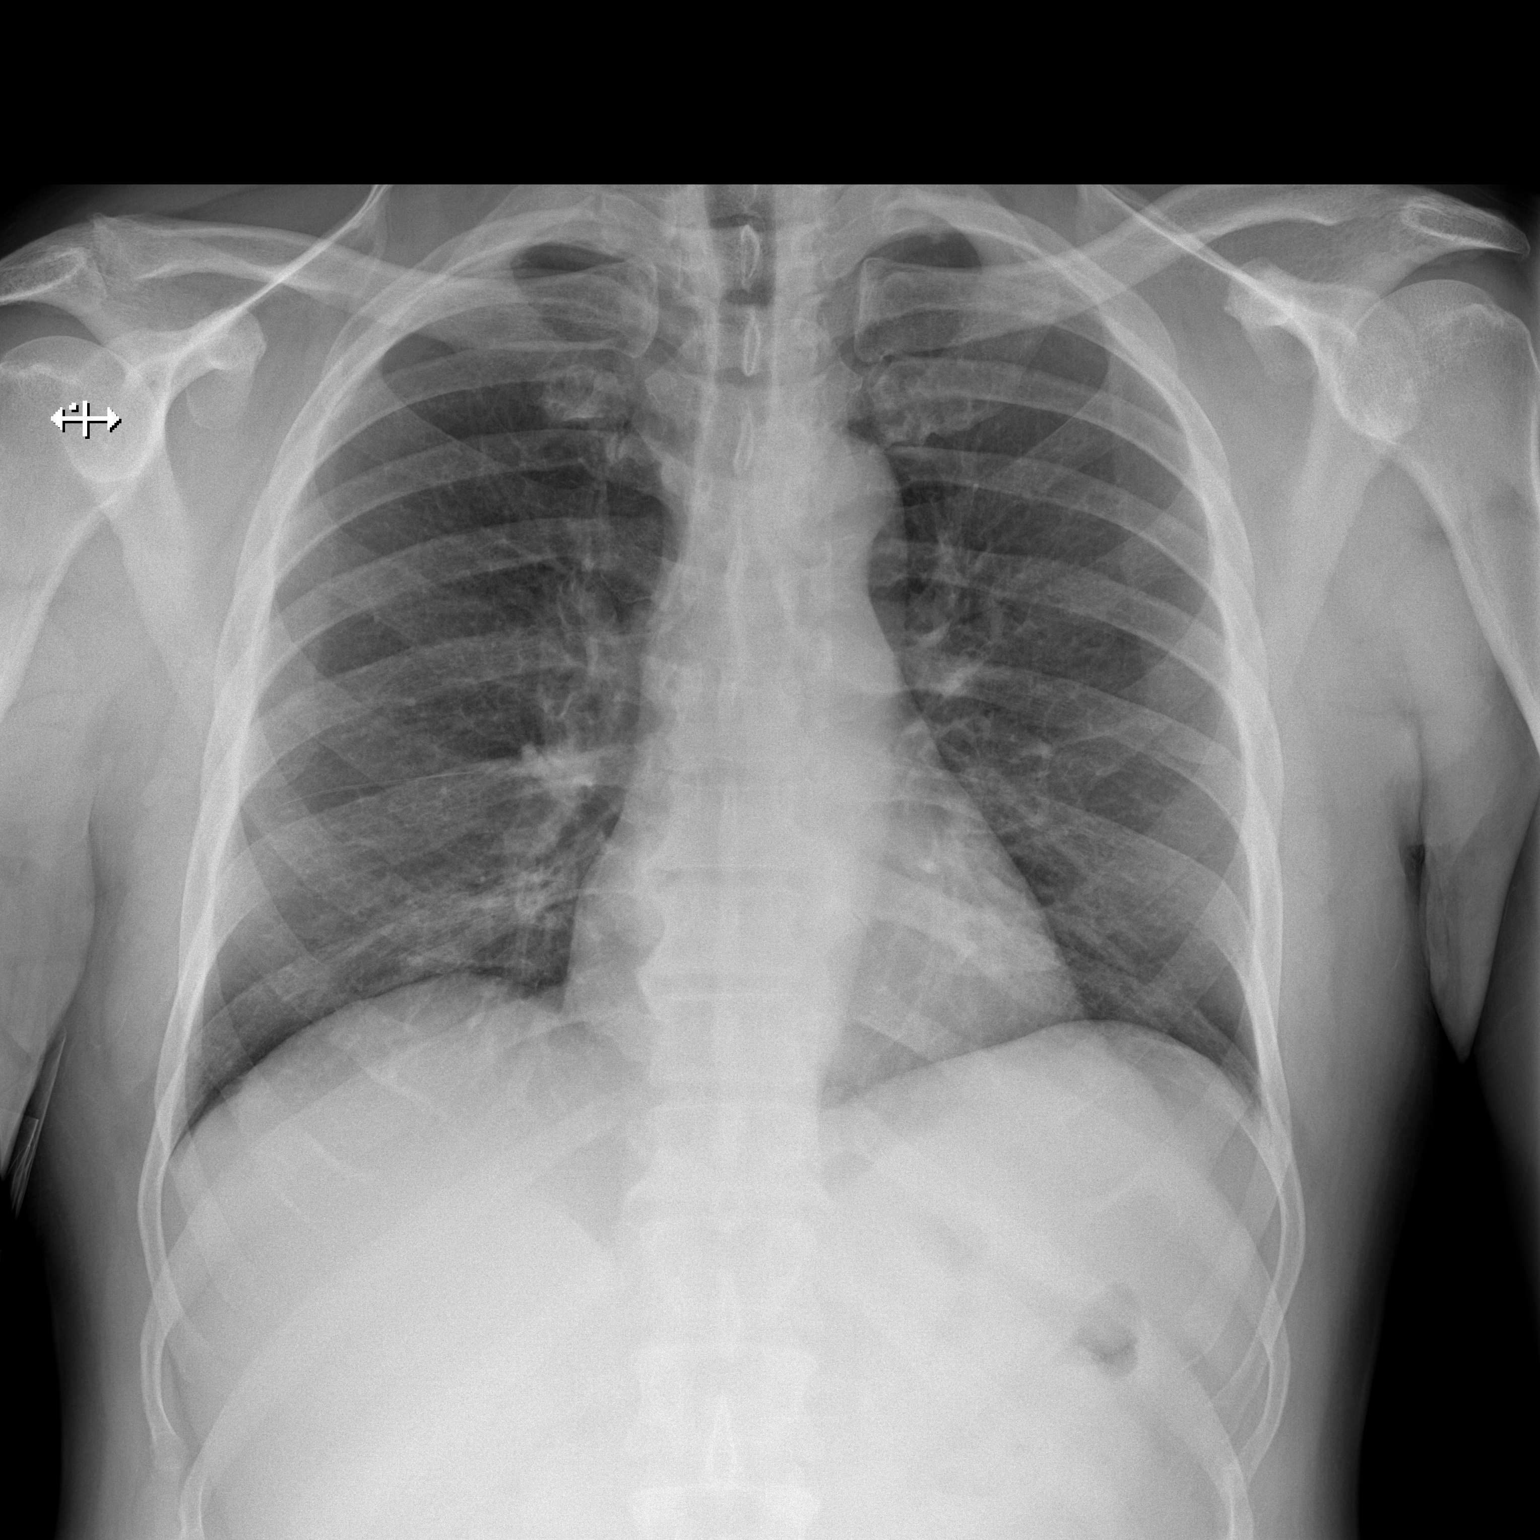

[w chest lat]
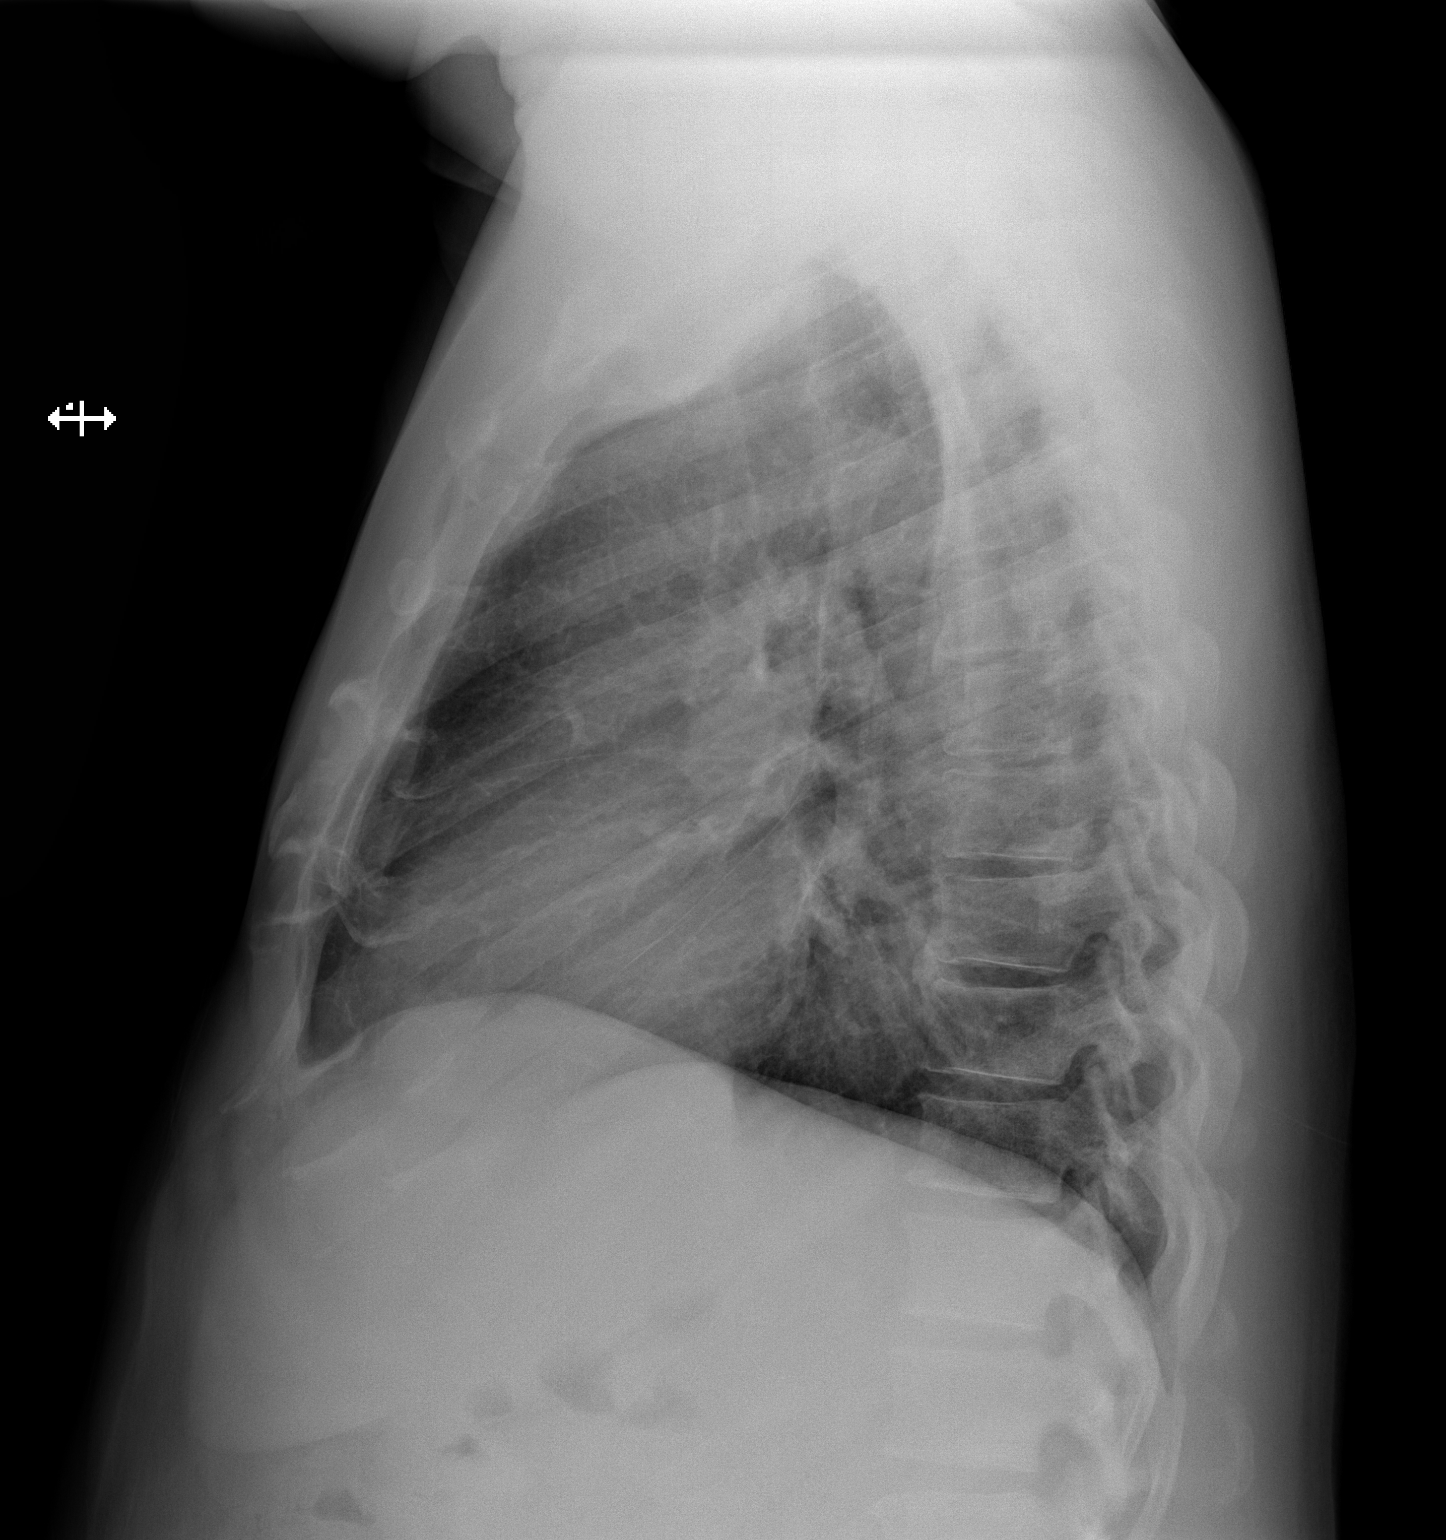

[2 of 2 positions shown; findings below may reference images not displayed]

FINDINGS: Mediastinum hilar structures normal. Heart size normal. Mild right
base atelectasis/infiltrate. No pleural effusion or pneumothorax. No
acute bony abnormality.
IMPRESSION: Mild right base atelectasis/infiltrate.

## 2022-11-03 DIAGNOSIS — M25611 Stiffness of right shoulder, not elsewhere classified: Secondary | ICD-10-CM | POA: Diagnosis not present

## 2022-11-03 DIAGNOSIS — M25511 Pain in right shoulder: Secondary | ICD-10-CM | POA: Diagnosis not present

## 2022-11-06 DIAGNOSIS — M25611 Stiffness of right shoulder, not elsewhere classified: Secondary | ICD-10-CM | POA: Diagnosis not present

## 2022-11-06 DIAGNOSIS — M25511 Pain in right shoulder: Secondary | ICD-10-CM | POA: Diagnosis not present

## 2022-11-10 DIAGNOSIS — M25611 Stiffness of right shoulder, not elsewhere classified: Secondary | ICD-10-CM | POA: Diagnosis not present

## 2022-11-10 DIAGNOSIS — M25511 Pain in right shoulder: Secondary | ICD-10-CM | POA: Diagnosis not present

## 2022-11-13 DIAGNOSIS — M25511 Pain in right shoulder: Secondary | ICD-10-CM | POA: Diagnosis not present

## 2022-11-13 DIAGNOSIS — M25611 Stiffness of right shoulder, not elsewhere classified: Secondary | ICD-10-CM | POA: Diagnosis not present

## 2022-11-17 DIAGNOSIS — M25611 Stiffness of right shoulder, not elsewhere classified: Secondary | ICD-10-CM | POA: Diagnosis not present

## 2022-11-17 DIAGNOSIS — M25511 Pain in right shoulder: Secondary | ICD-10-CM | POA: Diagnosis not present

## 2022-11-23 DIAGNOSIS — M25611 Stiffness of right shoulder, not elsewhere classified: Secondary | ICD-10-CM | POA: Diagnosis not present

## 2022-11-23 DIAGNOSIS — M25511 Pain in right shoulder: Secondary | ICD-10-CM | POA: Diagnosis not present

## 2022-11-26 DIAGNOSIS — M25511 Pain in right shoulder: Secondary | ICD-10-CM | POA: Diagnosis not present

## 2022-11-26 DIAGNOSIS — M25611 Stiffness of right shoulder, not elsewhere classified: Secondary | ICD-10-CM | POA: Diagnosis not present

## 2022-12-01 DIAGNOSIS — M25511 Pain in right shoulder: Secondary | ICD-10-CM | POA: Diagnosis not present

## 2022-12-01 DIAGNOSIS — M25611 Stiffness of right shoulder, not elsewhere classified: Secondary | ICD-10-CM | POA: Diagnosis not present

## 2022-12-10 DIAGNOSIS — M25511 Pain in right shoulder: Secondary | ICD-10-CM | POA: Diagnosis not present

## 2022-12-10 DIAGNOSIS — M25611 Stiffness of right shoulder, not elsewhere classified: Secondary | ICD-10-CM | POA: Diagnosis not present

## 2022-12-15 DIAGNOSIS — M25511 Pain in right shoulder: Secondary | ICD-10-CM | POA: Diagnosis not present

## 2022-12-15 DIAGNOSIS — M25611 Stiffness of right shoulder, not elsewhere classified: Secondary | ICD-10-CM | POA: Diagnosis not present

## 2022-12-17 DIAGNOSIS — M25611 Stiffness of right shoulder, not elsewhere classified: Secondary | ICD-10-CM | POA: Diagnosis not present

## 2022-12-17 DIAGNOSIS — M25511 Pain in right shoulder: Secondary | ICD-10-CM | POA: Diagnosis not present

## 2022-12-25 DIAGNOSIS — M25511 Pain in right shoulder: Secondary | ICD-10-CM | POA: Diagnosis not present

## 2022-12-25 DIAGNOSIS — M25611 Stiffness of right shoulder, not elsewhere classified: Secondary | ICD-10-CM | POA: Diagnosis not present

## 2022-12-30 DIAGNOSIS — E785 Hyperlipidemia, unspecified: Secondary | ICD-10-CM | POA: Diagnosis not present

## 2022-12-30 DIAGNOSIS — L403 Pustulosis palmaris et plantaris: Secondary | ICD-10-CM | POA: Diagnosis not present

## 2022-12-30 DIAGNOSIS — Z79899 Other long term (current) drug therapy: Secondary | ICD-10-CM | POA: Diagnosis not present

## 2022-12-30 DIAGNOSIS — I10 Essential (primary) hypertension: Secondary | ICD-10-CM | POA: Diagnosis not present

## 2022-12-30 DIAGNOSIS — K439 Ventral hernia without obstruction or gangrene: Secondary | ICD-10-CM | POA: Diagnosis not present

## 2023-01-17 DIAGNOSIS — M25561 Pain in right knee: Secondary | ICD-10-CM | POA: Diagnosis not present

## 2023-01-20 DIAGNOSIS — M25511 Pain in right shoulder: Secondary | ICD-10-CM | POA: Diagnosis not present

## 2023-01-20 DIAGNOSIS — M25611 Stiffness of right shoulder, not elsewhere classified: Secondary | ICD-10-CM | POA: Diagnosis not present

## 2023-01-26 DIAGNOSIS — M25611 Stiffness of right shoulder, not elsewhere classified: Secondary | ICD-10-CM | POA: Diagnosis not present

## 2023-01-26 DIAGNOSIS — M25511 Pain in right shoulder: Secondary | ICD-10-CM | POA: Diagnosis not present

## 2023-06-29 DIAGNOSIS — Z79899 Other long term (current) drug therapy: Secondary | ICD-10-CM | POA: Diagnosis not present

## 2023-06-29 DIAGNOSIS — E785 Hyperlipidemia, unspecified: Secondary | ICD-10-CM | POA: Diagnosis not present

## 2023-06-29 DIAGNOSIS — I10 Essential (primary) hypertension: Secondary | ICD-10-CM | POA: Diagnosis not present

## 2023-06-29 DIAGNOSIS — K439 Ventral hernia without obstruction or gangrene: Secondary | ICD-10-CM | POA: Diagnosis not present

## 2023-08-03 DIAGNOSIS — Z0000 Encounter for general adult medical examination without abnormal findings: Secondary | ICD-10-CM | POA: Diagnosis not present

## 2023-08-03 DIAGNOSIS — Z9181 History of falling: Secondary | ICD-10-CM | POA: Diagnosis not present

## 2023-11-10 DIAGNOSIS — K859 Acute pancreatitis without necrosis or infection, unspecified: Secondary | ICD-10-CM | POA: Diagnosis not present

## 2023-11-10 DIAGNOSIS — E785 Hyperlipidemia, unspecified: Secondary | ICD-10-CM | POA: Diagnosis not present

## 2023-11-10 DIAGNOSIS — J45909 Unspecified asthma, uncomplicated: Secondary | ICD-10-CM | POA: Diagnosis not present

## 2023-11-10 DIAGNOSIS — E876 Hypokalemia: Secondary | ICD-10-CM | POA: Diagnosis not present

## 2023-11-10 DIAGNOSIS — M1990 Unspecified osteoarthritis, unspecified site: Secondary | ICD-10-CM | POA: Diagnosis not present

## 2023-11-10 DIAGNOSIS — E871 Hypo-osmolality and hyponatremia: Secondary | ICD-10-CM | POA: Diagnosis not present

## 2023-11-10 DIAGNOSIS — F17210 Nicotine dependence, cigarettes, uncomplicated: Secondary | ICD-10-CM | POA: Diagnosis not present

## 2023-11-10 DIAGNOSIS — I10 Essential (primary) hypertension: Secondary | ICD-10-CM | POA: Diagnosis not present

## 2023-11-10 DIAGNOSIS — Z79899 Other long term (current) drug therapy: Secondary | ICD-10-CM | POA: Diagnosis not present

## 2023-11-10 DIAGNOSIS — E7800 Pure hypercholesterolemia, unspecified: Secondary | ICD-10-CM | POA: Diagnosis not present

## 2023-12-28 DIAGNOSIS — M5416 Radiculopathy, lumbar region: Secondary | ICD-10-CM | POA: Diagnosis not present

## 2023-12-28 DIAGNOSIS — K439 Ventral hernia without obstruction or gangrene: Secondary | ICD-10-CM | POA: Diagnosis not present

## 2023-12-28 DIAGNOSIS — E785 Hyperlipidemia, unspecified: Secondary | ICD-10-CM | POA: Diagnosis not present

## 2023-12-28 DIAGNOSIS — I10 Essential (primary) hypertension: Secondary | ICD-10-CM | POA: Diagnosis not present

## 2024-02-18 ENCOUNTER — Ambulatory Visit (HOSPITAL_BASED_OUTPATIENT_CLINIC_OR_DEPARTMENT_OTHER): Admitting: Orthopaedic Surgery

## 2024-02-18 ENCOUNTER — Ambulatory Visit (INDEPENDENT_AMBULATORY_CARE_PROVIDER_SITE_OTHER)

## 2024-02-18 DIAGNOSIS — M5416 Radiculopathy, lumbar region: Secondary | ICD-10-CM | POA: Diagnosis not present

## 2024-02-18 DIAGNOSIS — M25551 Pain in right hip: Secondary | ICD-10-CM

## 2024-02-18 DIAGNOSIS — M47816 Spondylosis without myelopathy or radiculopathy, lumbar region: Secondary | ICD-10-CM | POA: Diagnosis not present

## 2024-02-18 NOTE — Progress Notes (Signed)
    Chief Complaint: Right hip pain     History of Present Illness:    Richard Hunter is a 63 y.o. male presents today with ongoing right hip pain radiating about the lateral aspect of the hip down to the lower leg.  He does endorse a foot drop as well as difficulty moving the right leg with weakness.  He has been experiencing more tripping recently unfortunately.    PMH/PSH/Family History/Social History/Meds/Allergies:   No past medical history on file. No past surgical history on file. Social History   Socioeconomic History   Marital status: Single    Spouse name: Not on file   Number of children: Not on file   Years of education: Not on file   Highest education level: Not on file  Occupational History   Not on file  Tobacco Use   Smoking status: Not on file   Smokeless tobacco: Not on file  Substance and Sexual Activity   Alcohol use: Not on file   Drug use: Not on file   Sexual activity: Not on file  Other Topics Concern   Not on file  Social History Narrative   Not on file   Social Drivers of Health   Financial Resource Strain: Not on file  Food Insecurity: Not on file  Transportation Needs: Not on file  Physical Activity: Not on file  Stress: Not on file  Social Connections: Not on file   No family history on file. Not on File Current Outpatient Medications  Medication Sig Dispense Refill   ibuprofen  (ADVIL ) 600 MG tablet Take 1 tablet (600 mg total) by mouth every 6 (six) hours as needed for up to 30 doses for mild pain or moderate pain. 30 tablet 0   No current facility-administered medications for this visit.   No results found.  Review of Systems:   A ROS was performed including pertinent positives and negatives as documented in the HPI.  Physical Exam :   Constitutional: NAD and appears stated age Neurological: Alert and oriented Psych: Appropriate affect and cooperative There were no vitals taken for this visit.   Comprehensive  Musculoskeletal Exam:    Right leg with positive straight leg raise.  30 degrees internal rotation without pain, negative FADIR, no antalgic gait.  No tenderness about the trochanter with good abduction strength.   Imaging:     I personally reviewed and interpreted the radiographs.   Assessment and Plan:   63 y.o. male with evidence of lumbar radiculitis and unfortunately what appears to sound like a foot drop as well as weakness in the right leg.  Given the progression of this foot drop and neurologic symptoms I would like to obtain an MRI to rule out any type of significant disc herniation.  We will plan to proceed with this and I will see him back following discuss results  -Return to clinic following MRI lumbar spine   I personally saw and evaluated the patient, and participated in the management and treatment plan.  Elspeth Parker, MD Attending Physician, Orthopedic Surgery  This document was dictated using Dragon voice recognition software. A reasonable attempt at proof reading has been made to minimize errors.

## 2024-03-08 ENCOUNTER — Ambulatory Visit (HOSPITAL_COMMUNITY)
Admission: RE | Admit: 2024-03-08 | Discharge: 2024-03-08 | Disposition: A | Discharge status: Home / Self Care | Source: Ambulatory Visit | Attending: Orthopaedic Surgery | Admitting: Orthopaedic Surgery

## 2024-03-08 DIAGNOSIS — M5416 Radiculopathy, lumbar region: Secondary | ICD-10-CM

## 2024-03-10 ENCOUNTER — Ambulatory Visit (HOSPITAL_BASED_OUTPATIENT_CLINIC_OR_DEPARTMENT_OTHER): Admitting: Orthopaedic Surgery

## 2024-03-10 ENCOUNTER — Other Ambulatory Visit (HOSPITAL_BASED_OUTPATIENT_CLINIC_OR_DEPARTMENT_OTHER): Payer: Self-pay

## 2024-03-10 DIAGNOSIS — M5416 Radiculopathy, lumbar region: Secondary | ICD-10-CM

## 2024-03-10 MED ORDER — TRAMADOL HCL 50 MG PO TABS
50.0000 mg | ORAL_TABLET | Freq: Two times a day (BID) | ORAL | 1 refills | Status: AC | PRN
  Filled 2024-03-10: qty 30, 15d supply, fill #0

## 2024-03-10 NOTE — Progress Notes (Signed)
    Chief Complaint: Right hip pain     History of Present Illness:   03/10/2024: Presents today for further discussion and MRI discussion of his lumbar spine  Richard Hunter is a 63 y.o. male presents today with ongoing right hip pain radiating about the lateral aspect of the hip down to the lower leg.  He does endorse a foot drop as well as difficulty moving the right leg with weakness.  He has been experiencing more tripping recently unfortunately.    PMH/PSH/Family History/Social History/Meds/Allergies:   No past medical history on file. No past surgical history on file. Social History   Socioeconomic History   Marital status: Single    Spouse name: Not on file   Number of children: Not on file   Years of education: Not on file   Highest education level: Not on file  Occupational History   Not on file  Tobacco Use   Smoking status: Not on file   Smokeless tobacco: Not on file  Substance and Sexual Activity   Alcohol use: Not on file   Drug use: Not on file   Sexual activity: Not on file  Other Topics Concern   Not on file  Social History Narrative   Not on file   Social Drivers of Health   Financial Resource Strain: Not on file  Food Insecurity: Not on file  Transportation Needs: Not on file  Physical Activity: Not on file  Stress: Not on file  Social Connections: Not on file   No family history on file. Not on File Current Outpatient Medications  Medication Sig Dispense Refill   traMADol  (ULTRAM ) 50 MG tablet Take 1 tablet (50 mg total) by mouth every 12 (twelve) hours as needed. 30 tablet 1   ibuprofen  (ADVIL ) 600 MG tablet Take 1 tablet (600 mg total) by mouth every 6 (six) hours as needed for up to 30 doses for mild pain or moderate pain. 30 tablet 0   No current facility-administered medications for this visit.   No results found.  Review of Systems:   A ROS was performed including pertinent positives and negatives as documented in the  HPI.  Physical Exam :   Constitutional: NAD and appears stated age Neurological: Alert and oriented Psych: Appropriate affect and cooperative There were no vitals taken for this visit.   Comprehensive Musculoskeletal Exam:    Right leg with positive straight leg raise.  30 degrees internal rotation without pain, negative FADIR, no antalgic gait.  No tenderness about the trochanter with good abduction strength.   Imaging:     I personally reviewed and interpreted the radiographs.   Assessment and Plan:   63 y.o. male with evidence of lumbar radiculitis and unfortunately what appears to sound like a foot drop as well as weakness in the right leg.  At this time I have recommended an initial injection in the epidural space.  Will plan to keep a close eye on his weakness.  I would hope that this would resolve particularly with the injection.  - Epidural injection ordered  I personally saw and evaluated the patient, and participated in the management and treatment plan.  Elspeth Parker, MD Attending Physician, Orthopedic Surgery  This document was dictated using Dragon voice recognition software. A reasonable attempt at proof reading has been made to minimize errors.

## 2024-03-21 NOTE — Discharge Instructions (Signed)

## 2024-03-22 ENCOUNTER — Inpatient Hospital Stay
Admission: RE | Admit: 2024-03-22 | Discharge: 2024-03-22 | Disposition: A | Discharge status: Home / Self Care | Source: Ambulatory Visit | Attending: Orthopaedic Surgery

## 2024-03-22 DIAGNOSIS — M48061 Spinal stenosis, lumbar region without neurogenic claudication: Secondary | ICD-10-CM | POA: Diagnosis not present

## 2024-03-22 DIAGNOSIS — M5416 Radiculopathy, lumbar region: Secondary | ICD-10-CM

## 2024-03-22 DIAGNOSIS — M51362 Other intervertebral disc degeneration, lumbar region with discogenic back pain and lower extremity pain: Secondary | ICD-10-CM | POA: Diagnosis not present

## 2024-03-22 MED ORDER — METHYLPREDNISOLONE ACETATE 40 MG/ML INJ SUSP (RADIOLOG
80.0000 mg | Freq: Once | INTRAMUSCULAR | Status: AC
  Administered 2024-03-22: 80 mg via EPIDURAL

## 2024-03-22 MED ORDER — IOPAMIDOL (ISOVUE-M 200) INJECTION 41%
1.0000 mL | Freq: Once | INTRAMUSCULAR | Status: AC
  Administered 2024-03-22: 1 mL via EPIDURAL

## 2024-03-29 ENCOUNTER — Ambulatory Visit (HOSPITAL_BASED_OUTPATIENT_CLINIC_OR_DEPARTMENT_OTHER): Admitting: Orthopaedic Surgery

## 2024-04-04 ENCOUNTER — Other Ambulatory Visit (HOSPITAL_BASED_OUTPATIENT_CLINIC_OR_DEPARTMENT_OTHER): Payer: Self-pay

## 2024-04-17 ENCOUNTER — Other Ambulatory Visit (HOSPITAL_BASED_OUTPATIENT_CLINIC_OR_DEPARTMENT_OTHER): Payer: Self-pay | Admitting: Orthopaedic Surgery

## 2024-04-17 ENCOUNTER — Other Ambulatory Visit (HOSPITAL_BASED_OUTPATIENT_CLINIC_OR_DEPARTMENT_OTHER): Payer: Self-pay

## 2024-04-17 ENCOUNTER — Telehealth: Payer: Self-pay | Admitting: Orthopaedic Surgery

## 2024-04-17 MED ORDER — TRAMADOL HCL 50 MG PO TABS
50.0000 mg | ORAL_TABLET | Freq: Four times a day (QID) | ORAL | 2 refills | Status: DC | PRN
  Filled 2024-04-17: qty 30, 8d supply, fill #0
  Filled 2024-05-11: qty 30, 8d supply, fill #1
  Filled 2024-06-21: qty 30, 8d supply, fill #2

## 2024-04-17 NOTE — Telephone Encounter (Signed)
 Patient would like a refill on his pain medication called in.

## 2024-04-21 ENCOUNTER — Other Ambulatory Visit (HOSPITAL_BASED_OUTPATIENT_CLINIC_OR_DEPARTMENT_OTHER): Payer: Self-pay

## 2024-05-03 ENCOUNTER — Ambulatory Visit (HOSPITAL_BASED_OUTPATIENT_CLINIC_OR_DEPARTMENT_OTHER): Admitting: Orthopaedic Surgery

## 2024-05-05 ENCOUNTER — Telehealth (HOSPITAL_BASED_OUTPATIENT_CLINIC_OR_DEPARTMENT_OTHER): Payer: Self-pay | Admitting: Orthopaedic Surgery

## 2024-05-05 NOTE — Telephone Encounter (Signed)
 Eva at [facility] [phone] called and said that patient has two open scripts one down stairs at the pharmacy and one at the pharmacy in [place]. Just wants to know where the patient is going to get the meds

## 2024-05-08 ENCOUNTER — Encounter: Payer: Self-pay | Admitting: Radiology

## 2024-05-11 ENCOUNTER — Other Ambulatory Visit (HOSPITAL_BASED_OUTPATIENT_CLINIC_OR_DEPARTMENT_OTHER): Payer: Self-pay

## 2024-06-07 ENCOUNTER — Telehealth: Payer: Self-pay | Admitting: Orthopaedic Surgery

## 2024-06-07 ENCOUNTER — Other Ambulatory Visit (HOSPITAL_BASED_OUTPATIENT_CLINIC_OR_DEPARTMENT_OTHER): Payer: Self-pay | Admitting: Orthopaedic Surgery

## 2024-06-07 DIAGNOSIS — M5416 Radiculopathy, lumbar region: Secondary | ICD-10-CM

## 2024-06-07 NOTE — Telephone Encounter (Signed)
 Patient called and said that he needs a referral for back injection with sedation. CB#[phone]

## 2024-06-20 ENCOUNTER — Telehealth: Payer: Self-pay | Admitting: Orthopaedic Surgery

## 2024-06-20 NOTE — Telephone Encounter (Signed)
 Wife called saying that the pt referral needs to be sent to [facility] instead of [facility] because [facility] doesn't do sedation but [facility] does. Call back number is [phone]

## 2024-06-21 ENCOUNTER — Other Ambulatory Visit (HOSPITAL_BASED_OUTPATIENT_CLINIC_OR_DEPARTMENT_OTHER): Payer: Self-pay

## 2024-06-21 NOTE — Telephone Encounter (Signed)
 Working on this

## 2024-06-22 NOTE — Telephone Encounter (Signed)
 Sedation form filled out and sent to [facility] and April P with scheduling.

## 2024-06-27 ENCOUNTER — Other Ambulatory Visit

## 2024-07-11 ENCOUNTER — Other Ambulatory Visit (HOSPITAL_COMMUNITY): Payer: Self-pay | Admitting: Radiology

## 2024-07-11 NOTE — Progress Notes (Signed)
 Received request for EPI. Put request in review with Dr. Johann since the request included sedation. See below:  Johann Sieving, MD  Lin Delon CROME I dont see any indication for sedation [facility] we do w/o sedation We want them to tell us  what they are feeling so we don't hit any nerves etc But can do if medically indicated  DDH       Previous Messages    ----- Message ----- From: Lin Delon CROME Sent: 06/26/2024  11:04 AM EST To: Sieving Johann, MD Subject: EPI w/ sedation                                Good morning!  Just wanted to get the ok from you to schedule this guy for an EPI w/ sedation.  Thanks!  Reached back out to ordering provider who stated that sedation is not needed. Will call the patient/caregiver today to schedule wo sedation. JM Marcelline

## 2024-07-12 ENCOUNTER — Other Ambulatory Visit (HOSPITAL_COMMUNITY): Payer: Self-pay | Admitting: Radiology

## 2024-07-13 ENCOUNTER — Encounter (HOSPITAL_COMMUNITY): Payer: Self-pay

## 2024-07-13 ENCOUNTER — Ambulatory Visit (HOSPITAL_COMMUNITY)

## 2024-07-26 ENCOUNTER — Other Ambulatory Visit (HOSPITAL_BASED_OUTPATIENT_CLINIC_OR_DEPARTMENT_OTHER): Payer: Self-pay | Admitting: Orthopaedic Surgery

## 2024-08-02 ENCOUNTER — Telehealth (HOSPITAL_BASED_OUTPATIENT_CLINIC_OR_DEPARTMENT_OTHER): Payer: Self-pay | Admitting: Orthopaedic Surgery

## 2024-08-02 ENCOUNTER — Other Ambulatory Visit (HOSPITAL_BASED_OUTPATIENT_CLINIC_OR_DEPARTMENT_OTHER): Payer: Self-pay

## 2024-08-02 NOTE — Telephone Encounter (Signed)
 Patient would like a refill on meds tramdol until he can gets in to  the see the back Dr that Dr B   referred him to. The Dr went on vacation and he has been waiting for him to come back off vacation. Please send it to the [facility] on [address]

## 2024-08-03 ENCOUNTER — Other Ambulatory Visit (HOSPITAL_BASED_OUTPATIENT_CLINIC_OR_DEPARTMENT_OTHER): Payer: Self-pay | Admitting: Orthopaedic Surgery

## 2024-08-03 MED ORDER — TRAMADOL HCL 50 MG PO TABS: 50.0000 mg | ORAL_TABLET | Freq: Four times a day (QID) | ORAL | 0 refills | Status: AC | PRN
